# Patient Record
Sex: Male | Born: 1950 | Race: White | Hispanic: No | Marital: Married | Smoking: Current every day smoker
Health system: Southern US, Community
[De-identification: ages and names within clinical notes are randomized; demographics above are authoritative.]

## PROBLEM LIST (undated history)

## (undated) DIAGNOSIS — R569 Unspecified convulsions: Secondary | ICD-10-CM

## (undated) DIAGNOSIS — K5090 Crohn's disease, unspecified, without complications: Secondary | ICD-10-CM

## (undated) DIAGNOSIS — Z8781 Personal history of (healed) traumatic fracture: Secondary | ICD-10-CM

## (undated) DIAGNOSIS — S4292XA Fracture of left shoulder girdle, part unspecified, initial encounter for closed fracture: Secondary | ICD-10-CM

## (undated) DIAGNOSIS — S2239XA Fracture of one rib, unspecified side, initial encounter for closed fracture: Secondary | ICD-10-CM

## (undated) HISTORY — DX: Crohn's disease, unspecified, without complications: K50.90

## (undated) HISTORY — DX: Unspecified convulsions: R56.9

## (undated) HISTORY — DX: Personal history of (healed) traumatic fracture: Z87.81

## (undated) HISTORY — DX: Fracture of left shoulder girdle, part unspecified, initial encounter for closed fracture: S42.92XA

## (undated) HISTORY — DX: Fracture of one rib, unspecified side, initial encounter for closed fracture: S22.39XA

---

## 2003-07-09 DIAGNOSIS — R569 Unspecified convulsions: Secondary | ICD-10-CM

## 2003-07-09 HISTORY — DX: Unspecified convulsions: R56.9

## 2004-02-28 ENCOUNTER — Encounter: Admission: RE | Admit: 2004-02-28 | Discharge: 2004-02-28 | Payer: Self-pay | Admitting: Family Medicine

## 2004-03-07 ENCOUNTER — Emergency Department (HOSPITAL_COMMUNITY): Admission: EM | Admit: 2004-03-07 | Discharge: 2004-03-07 | Payer: Self-pay | Admitting: Emergency Medicine

## 2004-03-13 ENCOUNTER — Encounter (INDEPENDENT_AMBULATORY_CARE_PROVIDER_SITE_OTHER): Payer: Self-pay | Admitting: Cardiology

## 2004-03-13 ENCOUNTER — Ambulatory Visit (HOSPITAL_COMMUNITY): Admission: RE | Admit: 2004-03-13 | Discharge: 2004-03-13 | Payer: Self-pay | Admitting: Neurology

## 2004-06-23 ENCOUNTER — Emergency Department (HOSPITAL_COMMUNITY): Admission: EM | Admit: 2004-06-23 | Discharge: 2004-06-24 | Payer: Self-pay

## 2004-11-09 ENCOUNTER — Ambulatory Visit (HOSPITAL_COMMUNITY): Admission: RE | Admit: 2004-11-09 | Discharge: 2004-11-09 | Payer: Self-pay | Admitting: Gastroenterology

## 2006-12-12 ENCOUNTER — Encounter: Admission: RE | Admit: 2006-12-12 | Discharge: 2006-12-12 | Payer: Self-pay | Admitting: Gastroenterology

## 2006-12-18 ENCOUNTER — Encounter: Admission: RE | Admit: 2006-12-18 | Discharge: 2006-12-18 | Payer: Self-pay | Admitting: Gastroenterology

## 2009-12-22 ENCOUNTER — Encounter: Admission: RE | Admit: 2009-12-22 | Discharge: 2009-12-22 | Payer: Self-pay | Admitting: Gastroenterology

## 2009-12-22 ENCOUNTER — Inpatient Hospital Stay (HOSPITAL_COMMUNITY): Admission: AD | Admit: 2009-12-22 | Discharge: 2009-12-28 | Payer: Self-pay | Admitting: Gastroenterology

## 2010-03-08 ENCOUNTER — Encounter: Admission: RE | Admit: 2010-03-08 | Discharge: 2010-03-08 | Payer: Self-pay | Admitting: Gastroenterology

## 2010-06-07 HISTORY — PX: ABDOMINAL SURGERY: SHX537

## 2010-07-05 ENCOUNTER — Inpatient Hospital Stay (HOSPITAL_COMMUNITY)
Admission: RE | Admit: 2010-07-05 | Discharge: 2010-07-09 | Payer: Self-pay | Source: Home / Self Care | Attending: General Surgery | Admitting: General Surgery

## 2010-07-05 ENCOUNTER — Encounter (INDEPENDENT_AMBULATORY_CARE_PROVIDER_SITE_OTHER): Payer: Self-pay | Admitting: General Surgery

## 2010-09-17 LAB — CBC
HCT: 32.7 % — ABNORMAL LOW (ref 39.0–52.0)
HCT: 42.3 % (ref 39.0–52.0)
Hemoglobin: 13.1 g/dL (ref 13.0–17.0)
MCH: 29.2 pg (ref 26.0–34.0)
MCH: 29.4 pg (ref 26.0–34.0)
MCHC: 32.3 g/dL (ref 30.0–36.0)
MCV: 88.1 fL (ref 78.0–100.0)
MCV: 88.3 fL (ref 78.0–100.0)
MCV: 88.8 fL (ref 78.0–100.0)
Platelets: 186 10*3/uL (ref 150–400)
Platelets: 225 10*3/uL (ref 150–400)
RBC: 4.56 MIL/uL (ref 4.22–5.81)
RDW: 14 % (ref 11.5–15.5)
RDW: 14.2 % (ref 11.5–15.5)
WBC: 12.1 10*3/uL — ABNORMAL HIGH (ref 4.0–10.5)

## 2010-09-17 LAB — BASIC METABOLIC PANEL
Calcium: 8.3 mg/dL — ABNORMAL LOW (ref 8.4–10.5)
Calcium: 8.3 mg/dL — ABNORMAL LOW (ref 8.4–10.5)
Chloride: 101 mEq/L (ref 96–112)
Chloride: 96 mEq/L (ref 96–112)
Creatinine, Ser: 0.86 mg/dL (ref 0.4–1.5)
GFR calc Af Amer: >60 mL/min (ref >60)
GFR calc non Af Amer: >60 mL/min (ref >60)
Glucose, Bld: 94 mg/dL (ref 70–99)

## 2010-09-17 LAB — URINE MICROSCOPIC-ADD ON

## 2010-09-17 LAB — ANAEROBIC CULTURE

## 2010-09-17 LAB — URINALYSIS, ROUTINE W REFLEX MICROSCOPIC
Ketones, ur: NEGATIVE mg/dL
Protein, ur: NEGATIVE mg/dL
Specific Gravity, Urine: 1.016 (ref 1.005–1.030)
pH: 7 (ref 5.0–8.0)

## 2010-09-17 LAB — DIFFERENTIAL
Eosinophils Absolute: 0.1 10*3/uL (ref 0.0–0.7)
Lymphs Abs: 3.3 10*3/uL (ref 0.7–4.0)
Monocytes Relative: 9 % (ref 3–12)

## 2010-09-17 LAB — COMPREHENSIVE METABOLIC PANEL
ALT: 20 U/L (ref 0–53)
AST: 19 U/L (ref 0–37)
Alkaline Phosphatase: 58 U/L (ref 39–117)
Calcium: 9.3 mg/dL (ref 8.4–10.5)
Creatinine, Ser: 0.91 mg/dL (ref 0.4–1.5)
Glucose, Bld: 85 mg/dL (ref 70–99)
Total Protein: 6.7 g/dL (ref 6.0–8.3)

## 2010-09-17 LAB — SURGICAL PCR SCREEN
MRSA, PCR: POSITIVE — AB
Staphylococcus aureus: POSITIVE — AB

## 2010-09-17 LAB — CULTURE, ROUTINE-ABSCESS

## 2010-09-23 LAB — COMPREHENSIVE METABOLIC PANEL
ALT: 10 U/L (ref 0–53)
ALT: 12 U/L (ref 0–53)
ALT: 13 U/L (ref 0–53)
ALT: 22 U/L (ref 0–53)
ALT: 24 U/L (ref 0–53)
ALT: 32 U/L (ref 0–53)
AST: 15 U/L (ref 0–37)
AST: 21 U/L (ref 0–37)
AST: 31 U/L (ref 0–37)
AST: 39 U/L — ABNORMAL HIGH (ref 0–37)
Albumin: 2.3 g/dL — ABNORMAL LOW (ref 3.5–5.2)
Albumin: 2.3 g/dL — ABNORMAL LOW (ref 3.5–5.2)
Albumin: 2.7 g/dL — ABNORMAL LOW (ref 3.5–5.2)
Alkaline Phosphatase: 50 U/L (ref 39–117)
Alkaline Phosphatase: 59 U/L (ref 39–117)
Alkaline Phosphatase: 67 U/L (ref 39–117)
BUN: 12 mg/dL (ref 6–23)
BUN: 14 mg/dL (ref 6–23)
BUN: 14 mg/dL (ref 6–23)
BUN: 2 mg/dL — ABNORMAL LOW (ref 6–23)
CO2: 30 mEq/L (ref 19–32)
CO2: 31 mEq/L (ref 19–32)
CO2: 32 mEq/L (ref 19–32)
CO2: 33 mEq/L — ABNORMAL HIGH (ref 19–32)
Calcium: 8.1 mg/dL — ABNORMAL LOW (ref 8.4–10.5)
Calcium: 8.7 mg/dL (ref 8.4–10.5)
Chloride: 97 mEq/L (ref 96–112)
Chloride: 99 mEq/L (ref 96–112)
Creatinine, Ser: 0.94 mg/dL (ref 0.4–1.5)
Creatinine, Ser: 0.97 mg/dL (ref 0.4–1.5)
Creatinine, Ser: 0.99 mg/dL (ref 0.4–1.5)
Creatinine, Ser: 0.99 mg/dL (ref 0.4–1.5)
GFR calc Af Amer: >60 mL/min (ref >60)
GFR calc Af Amer: >60 mL/min (ref >60)
GFR calc Af Amer: >60 mL/min (ref >60)
GFR calc non Af Amer: >60 mL/min (ref >60)
GFR calc non Af Amer: >60 mL/min (ref >60)
GFR calc non Af Amer: >60 mL/min (ref >60)
Glucose, Bld: 75 mg/dL (ref 70–99)
Glucose, Bld: 81 mg/dL (ref 70–99)
Glucose, Bld: 85 mg/dL (ref 70–99)
Glucose, Bld: 89 mg/dL (ref 70–99)
Glucose, Bld: 97 mg/dL (ref 70–99)
Potassium: 3.4 mEq/L — ABNORMAL LOW (ref 3.5–5.1)
Potassium: 3.5 mEq/L (ref 3.5–5.1)
Potassium: 3.8 mEq/L (ref 3.5–5.1)
Potassium: 4.1 mEq/L (ref 3.5–5.1)
Sodium: 136 mEq/L (ref 135–145)
Sodium: 137 mEq/L (ref 135–145)
Sodium: 139 mEq/L (ref 135–145)
Sodium: 140 mEq/L (ref 135–145)
Total Bilirubin: 0.6 mg/dL (ref 0.3–1.2)
Total Bilirubin: 0.8 mg/dL (ref 0.3–1.2)
Total Bilirubin: 0.8 mg/dL (ref 0.3–1.2)
Total Bilirubin: 1.1 mg/dL (ref 0.3–1.2)
Total Protein: 6.1 g/dL (ref 6.0–8.3)
Total Protein: 6.4 g/dL (ref 6.0–8.3)
Total Protein: 6.6 g/dL (ref 6.0–8.3)
Total Protein: 7 g/dL (ref 6.0–8.3)
Total Protein: 7.3 g/dL (ref 6.0–8.3)

## 2010-09-23 LAB — CBC
HCT: 36.6 % — ABNORMAL LOW (ref 39.0–52.0)
HCT: 38.4 % — ABNORMAL LOW (ref 39.0–52.0)
HCT: 40.2 % (ref 39.0–52.0)
Hemoglobin: 11.7 g/dL — ABNORMAL LOW (ref 13.0–17.0)
Hemoglobin: 12.2 g/dL — ABNORMAL LOW (ref 13.0–17.0)
Hemoglobin: 12.5 g/dL — ABNORMAL LOW (ref 13.0–17.0)
Hemoglobin: 13.1 g/dL (ref 13.0–17.0)
Hemoglobin: 13.6 g/dL (ref 13.0–17.0)
MCHC: 33.5 g/dL (ref 30.0–36.0)
MCHC: 33.7 g/dL (ref 30.0–36.0)
MCV: 87.5 fL (ref 78.0–100.0)
MCV: 87.9 fL (ref 78.0–100.0)
MCV: 88 fL (ref 78.0–100.0)
MCV: 88.7 fL (ref 78.0–100.0)
MCV: 88.9 fL (ref 78.0–100.0)
Platelets: 338 10*3/uL (ref 150–400)
Platelets: 344 10*3/uL (ref 150–400)
Platelets: 375 10*3/uL (ref 150–400)
RBC: 3.97 MIL/uL — ABNORMAL LOW (ref 4.22–5.81)
RBC: 4.25 MIL/uL (ref 4.22–5.81)
RBC: 4.43 MIL/uL (ref 4.22–5.81)
RDW: 11.7 % (ref 11.5–15.5)
RDW: 12.5 % (ref 11.5–15.5)
RDW: 12.5 % (ref 11.5–15.5)
WBC: 7.1 10*3/uL (ref 4.0–10.5)
WBC: 9.4 10*3/uL (ref 4.0–10.5)
WBC: 9.7 10*3/uL (ref 4.0–10.5)

## 2010-09-23 LAB — DIFFERENTIAL
Basophils Absolute: 0 10*3/uL (ref 0.0–0.1)
Basophils Absolute: 0 10*3/uL (ref 0.0–0.1)
Basophils Absolute: 0 10*3/uL (ref 0.0–0.1)
Basophils Absolute: 0.1 10*3/uL (ref 0.0–0.1)
Basophils Relative: 0 % (ref 0–1)
Basophils Relative: 0 % (ref 0–1)
Basophils Relative: 1 % (ref 0–1)
Basophils Relative: 1 % (ref 0–1)
Eosinophils Absolute: 0.2 10*3/uL (ref 0.0–0.7)
Eosinophils Absolute: 0.2 10*3/uL (ref 0.0–0.7)
Eosinophils Absolute: 0.2 10*3/uL (ref 0.0–0.7)
Eosinophils Absolute: 0.4 10*3/uL (ref 0.0–0.7)
Eosinophils Relative: 2 % (ref 0–5)
Eosinophils Relative: 3 % (ref 0–5)
Eosinophils Relative: 3 % (ref 0–5)
Eosinophils Relative: 5 % (ref 0–5)
Lymphocytes Relative: 12 % (ref 12–46)
Lymphocytes Relative: 16 % (ref 12–46)
Lymphocytes Relative: 17 % (ref 12–46)
Lymphs Abs: 1.3 10*3/uL (ref 0.7–4.0)
Lymphs Abs: 1.7 10*3/uL (ref 0.7–4.0)
Lymphs Abs: 1.9 10*3/uL (ref 0.7–4.0)
Monocytes Absolute: 1 10*3/uL (ref 0.1–1.0)
Monocytes Absolute: 1.2 10*3/uL — ABNORMAL HIGH (ref 0.1–1.0)
Monocytes Relative: 11 % (ref 3–12)
Monocytes Relative: 11 % (ref 3–12)
Monocytes Relative: 12 % (ref 3–12)
Monocytes Relative: 17 % — ABNORMAL HIGH (ref 3–12)
Neutro Abs: 4.8 10*3/uL (ref 1.7–7.7)
Neutro Abs: 6.8 10*3/uL (ref 1.7–7.7)
Neutro Abs: 7.6 10*3/uL (ref 1.7–7.7)
Neutrophils Relative %: 58 % (ref 43–77)
Neutrophils Relative %: 59 % (ref 43–77)
Neutrophils Relative %: 60 % (ref 43–77)
Neutrophils Relative %: 70 % (ref 43–77)

## 2010-09-23 LAB — CLOSTRIDIUM DIFFICILE EIA: C difficile Toxins A+B, EIA: NEGATIVE

## 2010-11-23 NOTE — Op Note (Signed)
NAME:  Miguel Hoover, Miguel Hoover               ACCOUNT NO.:  [id number]   MEDICAL RECORD NO.:  [id number]          PATIENT TYPE:  AMB   LOCATION:  ENDO                         FACILITY:  [facility]   PHYSICIAN:  Wonda Horner, M.D.   DATE OF BIRTH:  1951/04/17   DATE OF PROCEDURE:  11/09/2004  DATE OF DISCHARGE:                                 OPERATIVE REPORT   PROCEDURE:  Colonoscopy.   INDICATIONS:  Screening.   INFORMED CONSENT:  Informed consent was obtained after explanation of the  risks of the bleeding and perforation.   PREMEDICATION:  Fentanyl 70 mcg intravenously, Versed 7 mg intravenously.   DESCRIPTION OF PROCEDURE:  With the patient in the left lateral decubitus  position, a rectal exam was performed and no masses were felt. The Olympus  colonoscope was inserted into the rectum and advanced throughout the colon  to the cecum. Cecal landmarks were identified. The cecum and ascending colon  along with the transverse colon were normal. The descending colon, sigmoid,  and rectum were normal. He tolerated the procedure well without  complications.   IMPRESSION:  Normal colonoscopy to the cecum.   RECOMMENDATIONS:  I would recommend a follow-up screening colonoscopy again  in 10 years.      SFG/MEDQ  D:  11/09/2004  T:  11/09/2004  Job:  [id number]   cc:   Dr. Sabra Heck

## 2010-12-10 NOTE — Discharge Summary (Signed)
NAME:  Miguel Hoover, Miguel Hoover               ACCOUNT NO.:  [id number]  MEDICAL RECORD NO.:  [id number]           PATIENT TYPE:  I  LOCATION:  2778                         FACILITY:  [facility]  PHYSICIAN:  Marland Kitchen T. Woodfin Kiss, M.D.DATE OF BIRTH:  [birth year]  DATE OF ADMISSION:  07/05/2010 DATE OF DISCHARGE:  07/09/2010                              DISCHARGE SUMMARY   DISCHARGE DIAGNOSES: 1. Crohn disease of the terminal ileum with stricture and     fistulization and abscess. 2. Subcutaneous mass, left elbow.  OPERATIONS AND PROCEDURES:  Laparoscopy and open ileocolectomy with anastomosis and excision of skin and subcutaneous mass, left elbow; Dr. Excell Seltzer; July 05, 2010.  HISTORY OF PRESENT ILLNESS:  Miguel Hoover is a 60 year old male with 27- year history of intermittent abdominal pain, in June of 2011 diagnosed with Crohn disease with thickening of the terminal ileum and a small adjacent phlegmon consistent with walled off perforation.  He was treated with IV antibiotics, steroids and improved and was discharged. He has been followed by Dr. Penelope Coop.  He has been on prednisone essentially continuously since that time.  He will have intermittent improvement and worsening of abdominal pain and nausea.  He had colonoscopy which was essentially negative, although terminal ileum was not able to be intubated.  Upper GI series shows narrowing of the terminal ileum.  The patient presents with ongoing abdominal pain, nausea.  He also has a mass on his left elbow which he states has been present since an injury at work about a year ago.  Physical exam shows some moderate right lower quadrant tenderness on abdominal exam.  Extremity exam revealed about 4 cm subcutaneous mass overlying the left elbow.  The patient after discussion in the office is electively admitted for ileocolectomy for Crohn disease with stricture and partial obstruction having excision of the left elbow mass.  HOSPITAL COURSE:   The patient was admitted on the morning of his procedure.  Laparoscopy was performed which showed intense adhesions around the terminal ileum and inflammatory process.  He was converted to open procedure and had findings of active Crohn disease of the terminal ileum with fistulization and abscess into the anterior abdominal wall. He underwent open ileocolectomy with primary anastomosis and also removal of the large subcutaneous mass over his elbow.  Postoperatively his course was smooth.  He was maintained on IV Invanz pre and postoperatively due to the abdominal wall abscess.  His NG tube was discontinued on the first postoperative day.  He was started on clear liquids on the second postoperative day.  His steroids were tapered.  By the third postoperative day, he was afebrile, white count was 8.8. Cultures from the abdominal wall were no growth.  By July 09, 2010, he was tolerating regular diet and had a couple of bowel movements. Incision over his elbow was clean.  Abdominal wound was clean and abdomen was benign.  Pathology showed chronic active Crohn's enteritis with acute serositis and inflammation and exudate.  The soft tissue mass of his elbow was a benign ruptured epidermal inclusion cyst.  DISCHARGE MEDICATIONS:  Same on admission plus Tylox for pain.  FOLLOWUP:  Follow up is to be in my office in 2 weeks.     Darene Lamer. Gerre Ranum, M.D.     [place]  D:  12/04/2010  T:  12/04/2010  Job:  [id number]  Electronically Signed by Excell Seltzer M.D. on 12/10/2010 03:35:59 PM

## 2011-12-03 IMAGING — RF DG UGI W/ SMALL BOWEL HIGH DENSITY
19 of 24 series · 19 of 24 positions shown · IV contrast (agent unspecified)
Comparison: CTs including 12/26/2009.

CLINICAL DATA: Right lower quadrant abdominal pain.  Abnormal
terminal ileum on prior CTs.  Family history of Crohn's disease.

UPPER GI W/ SMALL BOWEL HIGH DENSITY
TECHNIQUE: Upper GI series performed with high density barium and
effervescent agent. Thin barium also used. Subsequently, serial
images of the small bowel were obtained including spot views of the
terminal ileum.
Fluoroscopy Time:
Contrast: Thin and thick barium.

[Series 1: run · 1 of 1 slices shown (1 of 19)]
[im 1/1]
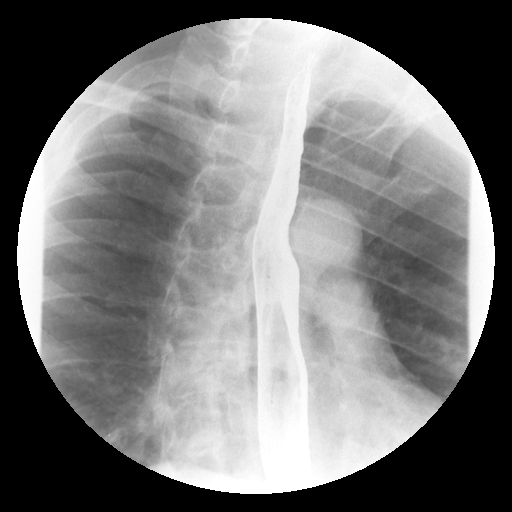

[Series 2: run · 1 of 1 slices shown (2 of 19)]
[im 1/1]
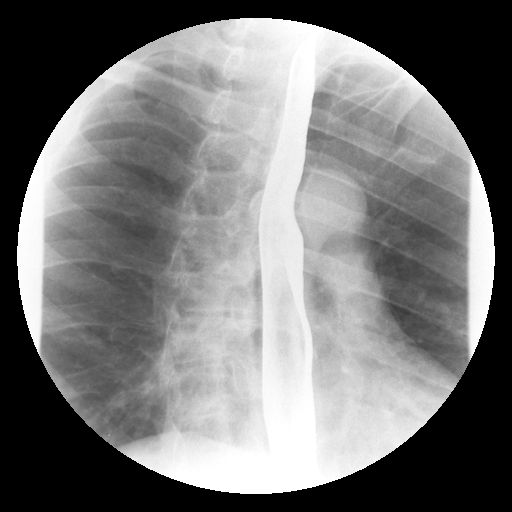

[Series 4: run · 1 of 1 slices shown (3 of 19)]
[im 1/1]
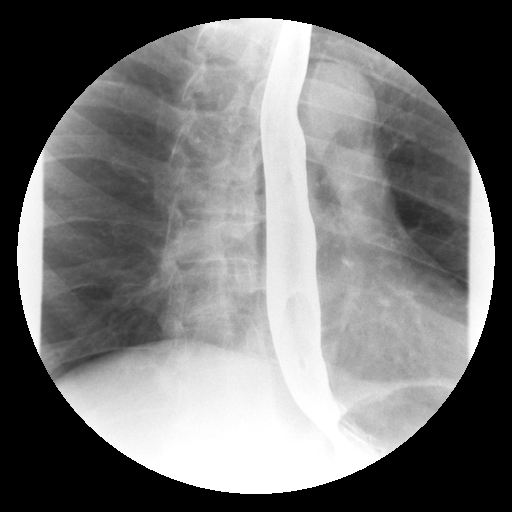

[Series 5: run · 1 of 1 slices shown (4 of 19)]
[im 1/1]
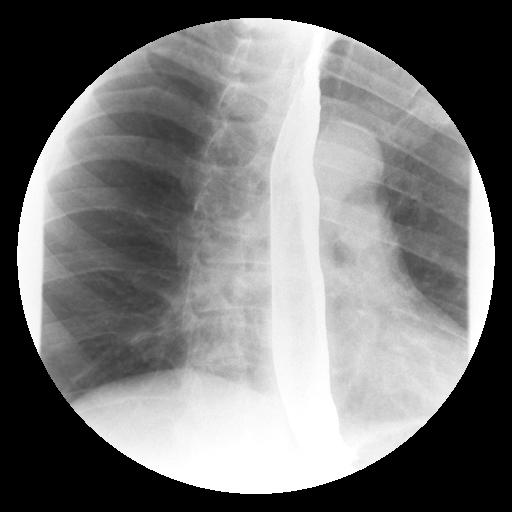

[Series 6: run · 1 of 1 slices shown (5 of 19)]
[im 1/1]
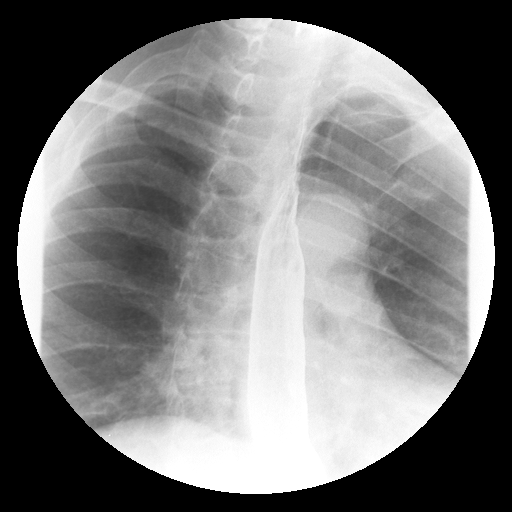

[Series 7: run · 1 of 1 slices shown (6 of 19)]
[im 1/1]
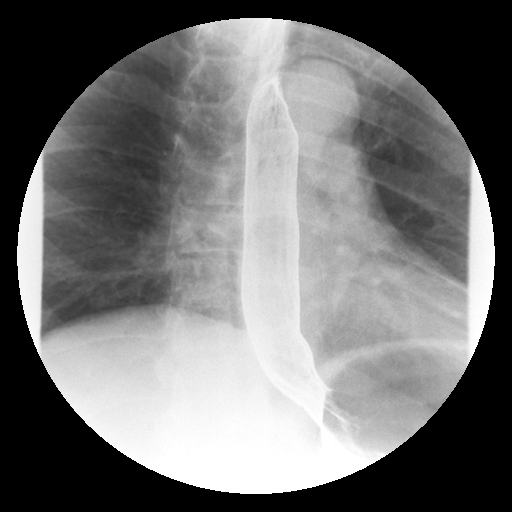

[Series 9: run · 1 of 1 slices shown (7 of 19)]
[im 1/1]
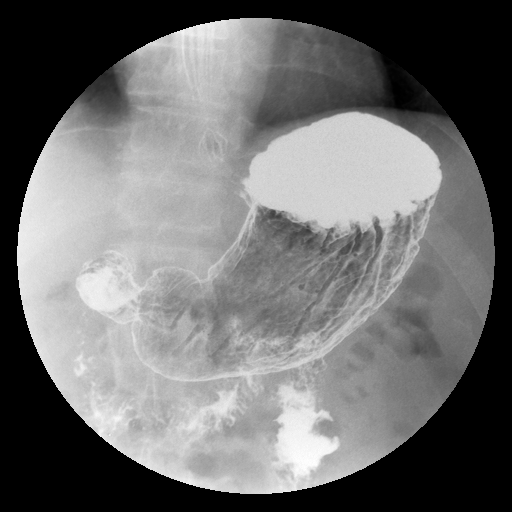

[Series 10: run · 1 of 1 slices shown (8 of 19)]
[im 1/1]
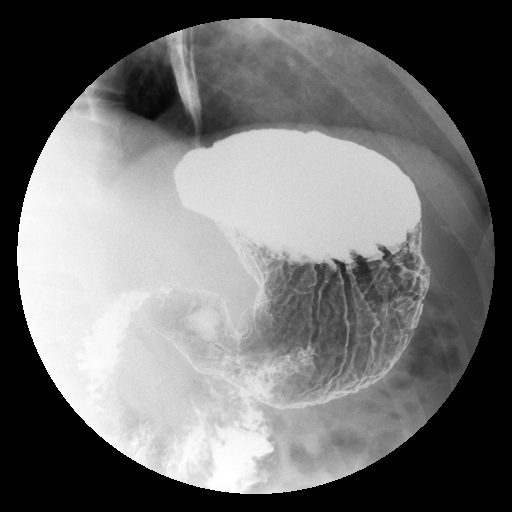

[Series 11: run · 1 of 1 slices shown (9 of 19)]
[im 1/1]
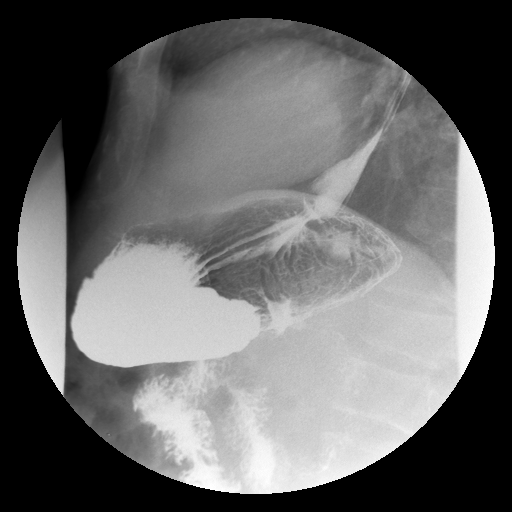

[Series 13: run · 1 of 1 slices shown (10 of 19)]
[im 1/1]
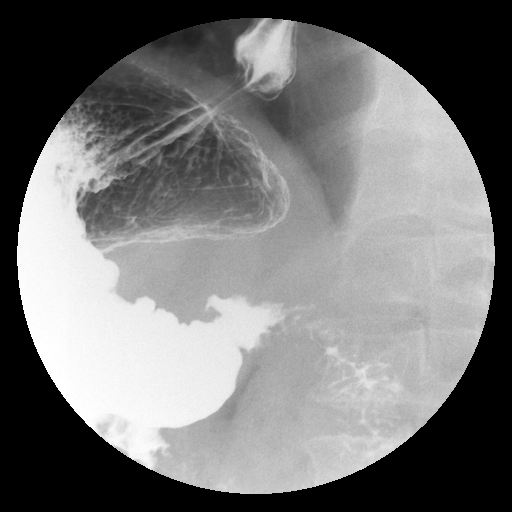

[Series 14: run · 1 of 1 slices shown (11 of 19)]
[im 1/1]
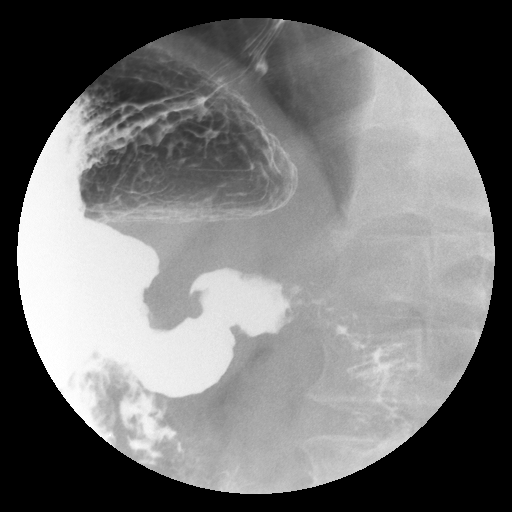

[Series 15: run · 1 of 1 slices shown (12 of 19)]
[im 1/1]
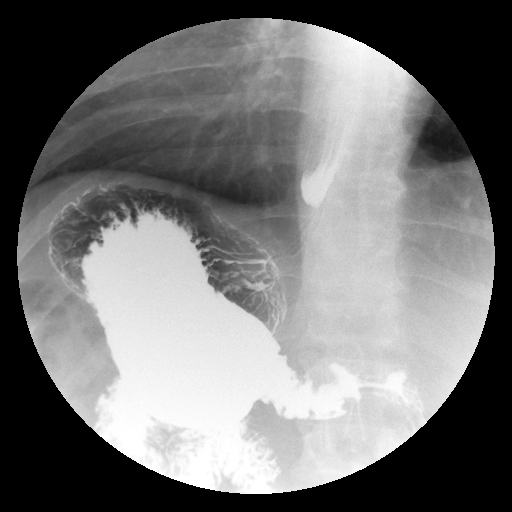

[Series 16: run · 1 of 1 slices shown (13 of 19)]
[im 1/1]
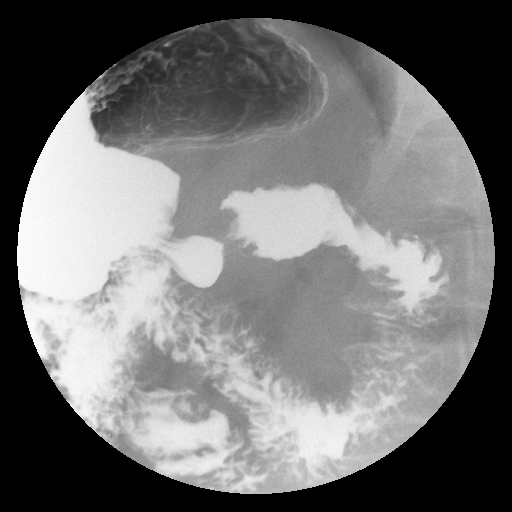

[Series 18: run · 1 of 1 slices shown (14 of 19)]
[im 1/1]
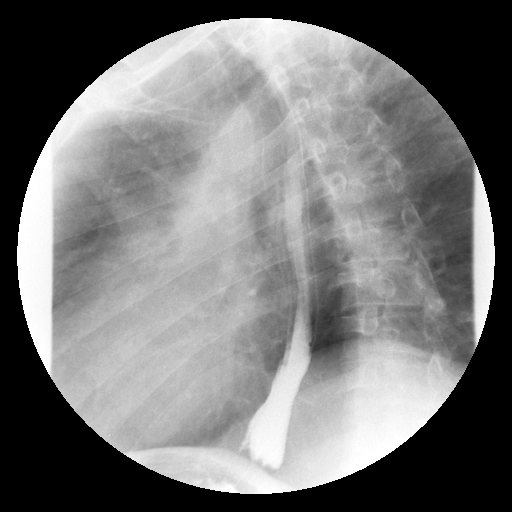

[Series 19: run · 1 of 1 slices shown (15 of 19)]
[im 1/1]
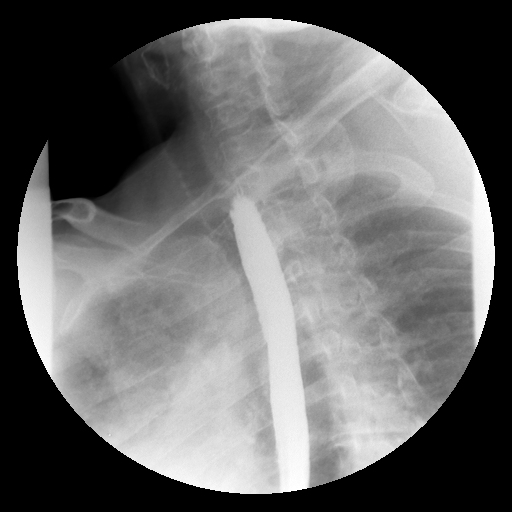

[Series 20: run · 1 of 1 slices shown (16 of 19)]
[im 1/1]
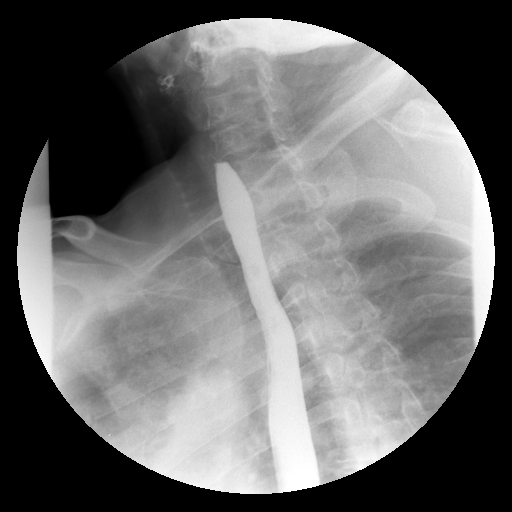

[Series 21: run · 1 of 1 slices shown (17 of 19)]
[im 1/1]
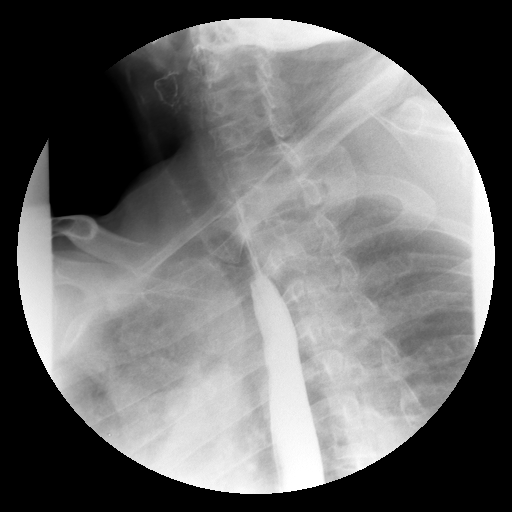

[Series 23: run · 1 of 1 slices shown (18 of 19)]
[im 1/1]
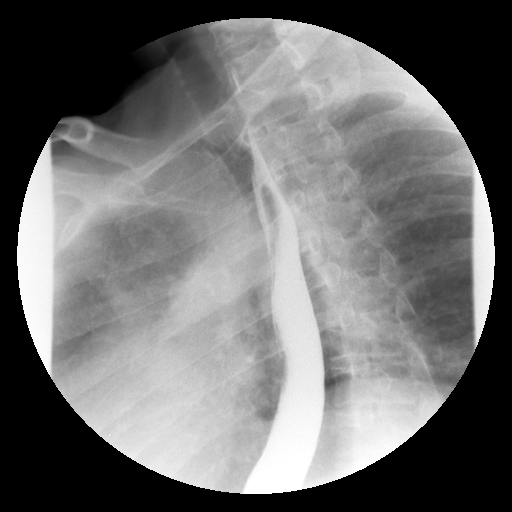

[Series 24: run · 1 of 1 slices shown (19 of 19)]
[im 1/1]
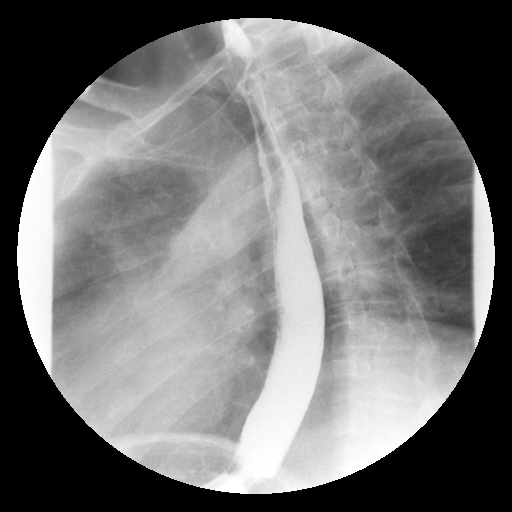

[19 of 24 positions shown; findings below may reference images not displayed]

FINDINGS: Pre procedure scout film demonstrates an unremarkable
bowel gas pattern.

Double contrast evaluation of the esophagus demonstrates no mucosal
abnormality.

Double contrast evaluation of the stomach demonstrates no mass or
polyp.  No evidence of ulcer.

The duodenal bulb and C-loop are within normal limits.

There is a tiny hiatal hernia.  There is mild spontaneous reflux of
contrast into the lower esophagus.

Evaluation of primary peristalsis demonstrates a normal primary
peristaltic wave.

Full column evaluation of the esophagus demonstrates no evidence of
narrowing or stricture.

Small bowel follow-through images demonstrate normal appearance of
the proximal small bowel.  Normal small bowel transit time,
approximately 30 minutes.

The cecum is positioned somewhat high.  The terminal ileum inserts
along its inferior portion.  There is marked narrowing of the
terminal ileum.  Most apparent on series 39.  There is a segment of
relatively normal caliber distal ileum with a skip area of
narrowing.  Series 43.
IMPRESSION: 1.  Findings which are highly suspicious for Crohn's disease.  2
areas of terminal and distal ileal narrowing.  Findings discussed
with the patient prior to this dictation.
2.  No evidence of bowel obstruction or other acute complication.
3.  Small hiatal hernia with minimal gastroesophageal reflux
spontaneously.

## 2012-11-16 ENCOUNTER — Encounter: Payer: Self-pay | Admitting: Neurology

## 2012-11-17 ENCOUNTER — Ambulatory Visit: Payer: Self-pay | Admitting: Neurology

## 2013-04-05 ENCOUNTER — Telehealth: Payer: Self-pay | Admitting: *Deleted

## 2013-04-05 MED ORDER — LAMOTRIGINE 200 MG PO TABS: ORAL_TABLET | ORAL | Status: DC

## 2013-04-05 NOTE — Telephone Encounter (Signed)
Rx sent 

## 2013-04-05 NOTE — Telephone Encounter (Signed)
Can you please refill generic lamictal and send it to [facility] on [address]. He has an appointment with Dr. Pearlean Brownie next month

## 2013-04-27 ENCOUNTER — Ambulatory Visit: Payer: Self-pay | Admitting: Neurology

## 2013-06-10 ENCOUNTER — Ambulatory Visit (INDEPENDENT_AMBULATORY_CARE_PROVIDER_SITE_OTHER): Payer: Self-pay | Admitting: Neurology

## 2013-06-10 ENCOUNTER — Encounter: Payer: Self-pay | Admitting: Neurology

## 2013-06-10 ENCOUNTER — Encounter (INDEPENDENT_AMBULATORY_CARE_PROVIDER_SITE_OTHER): Payer: Self-pay

## 2013-06-10 VITALS — BP 142/82 | HR 90 | Temp 98.2°F | Ht 65.0 in | Wt 167.0 lb

## 2013-06-10 DIAGNOSIS — I6350 Cerebral infarction due to unspecified occlusion or stenosis of unspecified cerebral artery: Secondary | ICD-10-CM

## 2013-06-20 ENCOUNTER — Other Ambulatory Visit: Payer: Self-pay | Admitting: Neurology

## 2013-06-20 NOTE — Telephone Encounter (Signed)
Patient has not been in for last 3 appts, now scheduled for Feb

## 2013-06-29 NOTE — Progress Notes (Signed)
Patient left before he could be seen

## 2013-08-17 ENCOUNTER — Ambulatory Visit (INDEPENDENT_AMBULATORY_CARE_PROVIDER_SITE_OTHER): Payer: Managed Care, Other (non HMO) | Admitting: Neurology

## 2013-08-17 ENCOUNTER — Encounter: Payer: Self-pay | Admitting: Neurology

## 2013-08-17 ENCOUNTER — Encounter (INDEPENDENT_AMBULATORY_CARE_PROVIDER_SITE_OTHER): Payer: Self-pay

## 2013-08-17 VITALS — BP 123/75 | HR 74 | Ht 65.0 in | Wt 167.0 lb

## 2013-08-17 DIAGNOSIS — R569 Unspecified convulsions: Secondary | ICD-10-CM

## 2013-08-17 DIAGNOSIS — G40209 Localization-related (focal) (partial) symptomatic epilepsy and epileptic syndromes with complex partial seizures, not intractable, without status epilepticus: Secondary | ICD-10-CM

## 2013-08-17 MED ORDER — LAMOTRIGINE 200 MG PO TABS
200.0000 mg | ORAL_TABLET | Freq: Every day | ORAL | Status: DC
Start: 2013-08-17 — End: 2014-04-14

## 2013-08-17 NOTE — Patient Instructions (Signed)
I had a long discussion with the patient regarding his seizures and long seizure free interval of nearly 10 years. I discussed risk benefit of tapering and stopping seizure medications but at the present time he is unwilling to do so. He has been taking lamotrigine 200 mg once a day for the last 2 years without breakthrough seizures and hence I will continue the present regimen for now. He was given refills. Check CBC and complete metabolic panel labs. Return for followup in a year her call earlier if necessary.

## 2013-08-17 NOTE — Progress Notes (Signed)
[facility] [address] [place]. [place] [address] [phone]       OFFICE FOLLOW-UP NOTE  Mr. Miguel Hoover. Date of Birth:  08-11-1950 Medical Record Number:  [id number]   HPI: 38 year Caucasian male with remote history of 3 seizures in 2005 unclear as to wether they were probably alcohol related or localization related epilepsy but he has been on lamotrigine since then. He was last seen in the office in 2012 and at that time was taking 100 mg in the morning and 150 mg at night. He states that however a couple of years ago his switch to taking 200 mg once a day and he's done well has not had any seizures  . In fact his last seizure was in 2005. He has been reluctant to discontinue medication   despite not having had seizures over the years. His tolerating the current dose of Lamictal 200 mg daily without significant side effects. He has not had any blood work checked in recent years. 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 He has no complaints today  ROS:   14 system review of systems is positive for no complaints today PMH:  Past Medical History  Diagnosis Date  . Crohn's disease     Social History:  History   Social History  . Marital Status: Married    Spouse Name: retta    Number of Children: 2  . Years of Education: college    Occupational History  . ESTIMATOR    Social History Main Topics  . Smoking status: Current Every Day Smoker    Types: Cigarettes  . Smokeless tobacco: Not on file  . Alcohol Use: No  . Drug Use: No  . Sexual Activity: Yes   Other Topics Concern  . Not on file   Social History Narrative  . No narrative on file    Medications:   Current Outpatient Prescriptions on File Prior to Visit  Medication Sig Dispense Refill  . levothyroxine (SYNTHROID, LEVOTHROID) 112 MCG tablet Take 112 mcg by mouth daily before breakfast.       No current facility-administered  medications on file prior to visit.    Allergies:  No Known Allergies  Physical Exam General: well developed, well nourished, seated, in no evident distress Head: head normocephalic and atraumatic. Orohparynx benign Neck: supple with no carotid or supraclavicular bruits Cardiovascular: regular rate and rhythm, no murmurs Musculoskeletal: no deformity Skin:  no rash/petichiae Vascular:  Normal pulses all extremities Filed Vitals:   08/17/13 1522  BP: 123/75  Pulse: 74   Neurologic Exam Mental Status: Awake and fully alert. Oriented to place and time. Recent and remote memory intact. Attention span, concentration and fund of knowledge appropriate. Mood and affect appropriate.  Cranial Nerves: Fundoscopic exam not done. Pupils equal, briskly reactive to light. Extraocular movements full without nystagmus. Visual fields full to confrontation. Hearing intact. Facial sensation intact. Face, tongue, palate moves normally and symmetrically.  Motor: Normal bulk and tone. Normal strength in all tested extremity muscles. Sensory.: intact to touch and pinprick and vibratory sensation.  Coordination: Rapid alternating movements normal in all extremities. Finger-to-nose and heel-to-shin performed accurately bilaterally. Gait and Station: Arises from chair without difficulty. Stance is normal. Gait demonstrates normal stride length and balance . Able to heel, toe and tandem walk without difficulty.  Reflexes: 1+ and symmetric. Toes downgoing.       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    PLAN: I had a long discussion with the patient regarding his seizures and long seizure free interval of nearly 10 years. I discussed risk benefit of tapering and stopping seizure medications but at the present time he is unwilling to do so. He has been taking  lamotrigine 200 mg once a day for the last 2 years without breakthrough seizures and hence I will continue the present regimen for now. He was given refills. Check CBC and complete metabolic panel labs. Return for followup in a year her call earlier if necessar   Note: This document was prepared with digital dictation and possible smart phrase technology. Any transcriptional errors that result from this process are unintentional

## 2014-01-05 ENCOUNTER — Encounter: Payer: Self-pay | Admitting: Neurology

## 2014-04-14 ENCOUNTER — Other Ambulatory Visit: Payer: Self-pay | Admitting: Neurology

## 2014-08-17 ENCOUNTER — Ambulatory Visit: Payer: Managed Care, Other (non HMO) | Admitting: Neurology

## 2014-09-20 ENCOUNTER — Ambulatory Visit: Payer: Managed Care, Other (non HMO) | Admitting: Neurology

## 2014-09-22 ENCOUNTER — Ambulatory Visit (INDEPENDENT_AMBULATORY_CARE_PROVIDER_SITE_OTHER): Payer: PRIVATE HEALTH INSURANCE | Admitting: Neurology

## 2014-09-22 ENCOUNTER — Encounter: Payer: Self-pay | Admitting: Neurology

## 2014-09-22 VITALS — BP 126/85 | HR 77 | Ht 66.0 in | Wt 165.0 lb

## 2014-09-22 DIAGNOSIS — G40009 Localization-related (focal) (partial) idiopathic epilepsy and epileptic syndromes with seizures of localized onset, not intractable, without status epilepticus: Secondary | ICD-10-CM | POA: Diagnosis not present

## 2014-09-22 MED ORDER — LAMOTRIGINE 200 MG PO TABS: 200.0000 mg | ORAL_TABLET | Freq: Every day | ORAL | Status: DC

## 2014-09-22 NOTE — Patient Instructions (Signed)
I had a long discussion with the patient with regards to his remote seizures and discuss briefly risk-benefit of tapering and trying to come off the medication since he has been seizure-free for several years. The patient however is comfortable taking the current dose of Lamotrigine and does not want to try to come off it. Plan to check CBC and complete metabolic panel labs today. He was given a refill for Lamictal for a year. Return for follow-up in a year or call earlier if necessary

## 2014-09-22 NOTE — Progress Notes (Signed)
[facility] [address] [place]. [place] [address] [phone]       OFFICE FOLLOW-UP NOTE  Miguel Hoover. Date of Birth:  April 25, 1951 Medical Record Number:  [id number]   HPI: 38 year Caucasian male with remote history of 3 seizures in 2005 unclear as to wether they were probably alcohol related or localization related epilepsy but he has been on lamotrigine since then. He was last seen in the office in 2012 and at that time was taking 100 mg in the morning and 150 mg at night. He states that however a couple of years ago his switch to taking 200 mg once a day and he's done well has not had any seizures  . In fact his last seizure was in 2005. He has been reluctant to discontinue medication   despite not having had seizures over the years. His tolerating the current dose of Lamictal 200 mg daily without significant side effects. He has not had any blood work checked in recent years. 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 He has no complaints today Update 09/22/2014 : He returns for follow-up after last visit a year ago. He continues to do well without any breakthrough seizures now for 10 years. His tolerating lamotrigine 200 mg daily without any side effects. He has not had any interval new health problems since last visit. He has no new complaints today. He is not sure if he had any blood work done after his last visit with me. ROS:   14 system review of systems is positive for no complaints today PMH:  Past Medical History  Diagnosis Date  . Crohn's disease     Social History:  History   Social History  . Marital Status: Married    Spouse Name: retta  . Number of Children: 2  . Years of Education: college    Occupational History  . ESTIMATOR    Social History Main Topics  . Smoking status: Current Every Day Smoker    Types: Cigarettes  . Smokeless tobacco: Not on file  . Alcohol Use: No  . Drug  Use: No  . Sexual Activity: Yes   Other Topics Concern  . Not on file   Social History Narrative    Medications:   No current outpatient prescriptions on file prior to visit.   No current facility-administered medications on file prior to visit.    Allergies:  No Known Allergies  Physical Exam General: well developed, well nourished middle aged Caucasian male, seated, in no evident distress Head: head normocephalic and atraumatic. Orohparynx benign Neck: supple with no carotid or supraclavicular bruits Cardiovascular: regular rate and rhythm, no murmurs Musculoskeletal: no deformity Skin:  no rash/petichiae Vascular:  Normal pulses all extremities Filed Vitals:   09/22/14 1017  BP: 126/85  Pulse: 77   Neurologic Exam Mental Status: Awake and fully alert. Oriented to place and time. Recent and remote memory intact. Attention span, concentration and fund of knowledge appropriate. Mood and affect appropriate.  Cranial Nerves: Fundoscopic exam not done. Pupils equal, briskly reactive to light. Extraocular movements full without nystagmus. Visual fields full to confrontation. Hearing intact. Facial sensation intact. Face, tongue, palate moves normally and symmetrically.  Motor: Normal bulk and tone. Normal strength in all tested extremity muscles. Sensory.: intact to touch and pinprick and vibratory sensation.  Coordination: Rapid alternating movements normal in all extremities. Finger-to-nose and heel-to-shin performed accurately bilaterally. Gait and Station: Arises from chair  without difficulty. Stance is normal. Gait demonstrates normal stride length and balance . Able to heel, toe and tandem walk without difficulty.  Reflexes: 1+ and symmetric. Toes downgoing.       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    PLAN: I had a  long discussion with the patient with regards to his remote seizures and discuss briefly risk-benefit of tapering and trying to come off the medication since he has been seizure-free for several years. The patient however is comfortable taking the current dose of Lamotrigine and does not want to try to come off it. Plan to check CBC and complete metabolic panel labs today. He was given a refill for Lamictal for a year. Return for follow-up in a year or call earlier if necessary    Note: This document was prepared with digital dictation and possible smart phrase technology. Any transcriptional errors that result from this process are unintentional

## 2014-09-23 LAB — COMPREHENSIVE METABOLIC PANEL
A/G RATIO: 1.3 (ref 1.1–2.5)
ALK PHOS: 74 IU/L (ref 39–117)
ALT: 30 IU/L (ref 0–44)
AST: 25 IU/L (ref 0–40)
Albumin: 4.3 g/dL (ref 3.6–4.8)
BUN / CREAT RATIO: 13 (ref 10–22)
BUN: 14 mg/dL (ref 8–27)
Bilirubin Total: 0.7 mg/dL (ref 0.0–1.2)
CO2: 25 mmol/L (ref 18–29)
CREATININE: 1.1 mg/dL (ref 0.76–1.27)
Calcium: 10 mg/dL (ref 8.6–10.2)
Chloride: 99 mmol/L (ref 97–108)
GFR, EST AFRICAN AMERICAN: 83 mL/min/{1.73_m2} (ref >59)
GFR, EST NON AFRICAN AMERICAN: 72 mL/min/{1.73_m2} (ref >59)
GLOBULIN, TOTAL: 3.3 g/dL (ref 1.5–4.5)
Glucose: 105 mg/dL — ABNORMAL HIGH (ref 65–99)
Potassium: 5.3 mmol/L — ABNORMAL HIGH (ref 3.5–5.2)
SODIUM: 138 mmol/L (ref 134–144)
Total Protein: 7.6 g/dL (ref 6.0–8.5)

## 2014-09-23 LAB — CBC
HEMATOCRIT: 49.5 % (ref 37.5–51.0)
Hemoglobin: 16.7 g/dL (ref 12.6–17.7)
MCH: 30.8 pg (ref 26.6–33.0)
MCHC: 33.7 g/dL (ref 31.5–35.7)
MCV: 91 fL (ref 79–97)
PLATELETS: 221 10*3/uL (ref 150–379)
RBC: 5.43 x10E6/uL (ref 4.14–5.80)
RDW: 13.6 % (ref 12.3–15.4)
WBC: 8.4 10*3/uL (ref 3.4–10.8)

## 2015-02-06 DIAGNOSIS — S4292XA Fracture of left shoulder girdle, part unspecified, initial encounter for closed fracture: Secondary | ICD-10-CM

## 2015-02-06 DIAGNOSIS — Z8781 Personal history of (healed) traumatic fracture: Secondary | ICD-10-CM

## 2015-02-06 DIAGNOSIS — S2249XA Multiple fractures of ribs, unspecified side, initial encounter for closed fracture: Secondary | ICD-10-CM

## 2015-02-06 HISTORY — DX: Personal history of (healed) traumatic fracture: Z87.81

## 2015-02-06 HISTORY — DX: Multiple fractures of ribs, unspecified side, initial encounter for closed fracture: S22.49XA

## 2015-02-06 HISTORY — DX: Fracture of left shoulder girdle, part unspecified, initial encounter for closed fracture: S42.92XA

## 2015-02-13 ENCOUNTER — Other Ambulatory Visit: Payer: Self-pay | Admitting: Orthopedic Surgery

## 2015-02-13 DIAGNOSIS — M25512 Pain in left shoulder: Secondary | ICD-10-CM

## 2015-02-14 ENCOUNTER — Other Ambulatory Visit: Payer: Self-pay | Admitting: Orthopedic Surgery

## 2015-02-14 ENCOUNTER — Ambulatory Visit
Admission: RE | Admit: 2015-02-14 | Discharge: 2015-02-14 | Disposition: A | Discharge status: Home / Self Care | Payer: PRIVATE HEALTH INSURANCE | Source: Ambulatory Visit | Attending: Orthopedic Surgery | Admitting: Orthopedic Surgery

## 2015-02-14 DIAGNOSIS — M25512 Pain in left shoulder: Secondary | ICD-10-CM

## 2015-02-15 ENCOUNTER — Other Ambulatory Visit: Payer: Self-pay

## 2015-09-20 ENCOUNTER — Ambulatory Visit (INDEPENDENT_AMBULATORY_CARE_PROVIDER_SITE_OTHER): Payer: PRIVATE HEALTH INSURANCE | Admitting: Nurse Practitioner

## 2015-09-20 ENCOUNTER — Ambulatory Visit: Payer: PRIVATE HEALTH INSURANCE | Admitting: Neurology

## 2015-09-20 ENCOUNTER — Encounter: Payer: Self-pay | Admitting: Nurse Practitioner

## 2015-09-20 VITALS — BP 118/74 | HR 77 | Ht 66.0 in | Wt 167.4 lb

## 2015-09-20 DIAGNOSIS — G40209 Localization-related (focal) (partial) symptomatic epilepsy and epileptic syndromes with complex partial seizures, not intractable, without status epilepticus: Secondary | ICD-10-CM | POA: Diagnosis not present

## 2015-09-20 MED ORDER — LAMOTRIGINE 200 MG PO TABS: 200.0000 mg | ORAL_TABLET | Freq: Every day | ORAL | Status: DC

## 2015-09-20 NOTE — Progress Notes (Signed)
[facility]  PATIENT: Miguel Hoover. DOB: March 14, 1951   REASON FOR VISIT: Follow-up for seizure disorder HISTORY FROM: Patient    HISTORY OF PRESENT ILLNESS: HISTORY: PS89 year Caucasian male with remote history of 3 seizures in 2005 unclear as to wether they were probably alcohol related or localization related epilepsy but he has been on lamotrigine since then. He was last seen in the office in 2012 and at that time was taking 100 mg in the morning and 150 mg at night. He states that however a couple of years ago his switch to taking 200 mg once a day and he's done well has not had any seizures . In fact his last seizure was in 2005. He has been reluctant to discontinue medication despite not having had seizures over the years. His tolerating the current dose of Lamictal 200 mg daily without significant side effects. He has not had any blood work checked in recent years. 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 He has no complaints today Update 3/17/2016PS : He returns for follow-up after last visit a year ago. He continues to do well without any breakthrough seizures now for 10 years. His tolerating lamotrigine 200 mg daily without any side effects. He has not had any interval new health problems since last visit. He has no new complaints today. He is not sure if he had any blood work done after his last visit with me. UPDATE: 03/15/2017CMMr. Hoover, 65 year old male returns for follow-up. He has a history of seizure disorder and last seizure was 11 years ago. He has had no new interval problems except he did fracture shoulder when he [place] a [place]. His labs are obtained through primary care and he says he just had those done  last week. He denies any side effects to Lamictal. He returns for reevaluation.  REVIEW OF SYSTEMS: Full 14 system review of systems performed and  notable only for those listed, all others are neg:  Constitutional: neg  Cardiovascular: neg Ear/Nose/Throat: neg  Skin: neg Eyes: neg Respiratory: neg Gastroitestinal: neg  Hematology/Lymphatic: neg  Endocrine: neg Musculoskeletal:neg Allergy/Immunology: neg Neurological: neg Psychiatric: neg Sleep : neg   ALLERGIES: No Known Allergies  HOME MEDICATIONS: Outpatient Prescriptions Prior to Visit  Medication Sig Dispense Refill  . clotrimazole-betamethasone (LOTRISONE) cream Apply 1 application topically as needed.    . lamoTRIgine (LAMICTAL) 200 MG tablet Take 1 tablet (200 mg total) by mouth daily. 90 tablet 3  . levothyroxine (SYNTHROID, LEVOTHROID) 137 MCG tablet Take 137 mcg by mouth daily.     No facility-administered medications prior to visit.    PAST MEDICAL HISTORY: Past Medical History  Diagnosis Date  . Crohn's disease ([place])   . Rib fractures 02/2015    x 3  . Shoulder fracture, left 02/2015  . H/O: facial fractures 02/2015  . Seizures ([place]) 2005    x 3    PAST SURGICAL HISTORY: Past Surgical History  Procedure Laterality Date  . Abdominal surgery  12/11    FAMILY HISTORY: Family History  Problem Relation Age of Onset  . Cancer Father     SOCIAL HISTORY: Social History   Social History  . Marital Status: Married    Spouse Name: retta  . Number of Children: 2  . Years of Education: college    Occupational History  . ESTIMATOR    Social History Main Topics  .  Smoking status: Current Every Day Smoker -- 0.50 packs/day    Types: Cigarettes  . Smokeless tobacco: Not on file  . Alcohol Use: No  . Drug Use: No  . Sexual Activity: Yes   Other Topics Concern  . Not on file   Social History Narrative     PHYSICAL EXAM  Filed Vitals:   09/20/15 0752  BP: 118/74  Pulse: 77  Height: 5' 6"  (1.676 m)  Weight: 167 lb 6.4 oz (75.932 kg)   Body mass index is 27.03 kg/(m^2).  Generalized: Well developed, in no acute distress  Head:  normocephalic and atraumatic,. Oropharynx benign  Neck: Supple, no carotid bruits  Cardiac: Regular rate rhythm, no murmur  Musculoskeletal: No deformity   Neurological examination   Mentation: Alert oriented to time, place, history taking. Attention span and concentration appropriate. Recent and remote memory intact.  Follows all commands speech and language fluent.   Cranial nerve II-XII: Pupils were equal round reactive to light extraocular movements were full, visual field were full on confrontational test. Facial sensation and strength were normal. hearing was intact to finger rubbing bilaterally. Uvula tongue midline. head turning and shoulder shrug were normal and symmetric.Tongue protrusion into cheek strength was normal. Motor: normal bulk and tone, full strength in the BUE, BLE, fine finger movements normal, no pronator drift. No focal weakness Sensory: normal and symmetric to light touch, pinprick, and  Vibration, proprioception  Coordination: finger-nose-finger, heel-to-shin bilaterally, no dysmetria Reflexes: 1+ upper lower and symmetric plantar responses were flexor bilaterally. Gait and Station: Rising up from seated position without assistance, normal stance,  moderate stride, good arm swing, smooth turning, able to perform tiptoe, and heel walking without difficulty. Tandem gait is steady  DIAGNOSTIC DATA (LABS, IMAGING, TESTING) - I reviewed patient records, labs, notes, testing and imaging myself where available.  Lab Results  Component Value Date   WBC 8.4 09/22/2014   HGB 16.7 09/22/2014   HCT 49.5 09/22/2014   MCV 91 09/22/2014   PLT 221 09/22/2014      Component Value Date/Time   NA 138 09/22/2014 1048   NA 137 07/08/2010 0545   K 5.3* 09/22/2014 1048   CL 99 09/22/2014 1048   CO2 25 09/22/2014 1048   GLUCOSE 105* 09/22/2014 1048   GLUCOSE 75 07/08/2010 0545   BUN 14 09/22/2014 1048   BUN 10 07/08/2010 0545   CREATININE 1.10 09/22/2014 1048   CALCIUM 10.0  09/22/2014 1048   PROT 7.6 09/22/2014 1048   PROT 6.7 06/28/2010 0845   ALBUMIN 4.3 09/22/2014 1048   ALBUMIN 3.0* 06/28/2010 0845   AST 25 09/22/2014 1048   ALT 30 09/22/2014 1048   ALKPHOS 74 09/22/2014 1048   BILITOT 0.7 09/22/2014 1048   BILITOT 0.5 06/28/2010 0845   GFRNONAA 72 09/22/2014 1048   GFRAA 83 09/22/2014 1048    ASSESSMENT AND PLAN  65 y.o. year old male  has a past medical history of Crohn's disease ([place]); Rib fractures (02/2015); Shoulder fracture, left (02/2015); H/O: facial fractures (02/2015); and Seizures ([place]) (2005). here to follow-up for his seizure disorder. Last seizure was 11 years ago.  Continue Lamictal at current dose will refill for one year Follow-up yearly and when necessary Dennie Bible, [facility], [facility], [facility] [address], [place], [place] [address] [phone]

## 2015-09-20 NOTE — Patient Instructions (Signed)
Continue Lamictal at current dose will refill for one year Follow-up yearly and when necessary  

## 2015-09-20 NOTE — Progress Notes (Signed)
I agree with the above plan 

## 2015-10-02 ENCOUNTER — Other Ambulatory Visit: Payer: Self-pay | Admitting: Neurology

## 2016-09-19 ENCOUNTER — Encounter: Payer: Self-pay | Admitting: Nurse Practitioner

## 2016-09-19 ENCOUNTER — Encounter (INDEPENDENT_AMBULATORY_CARE_PROVIDER_SITE_OTHER): Payer: Self-pay

## 2016-09-19 ENCOUNTER — Ambulatory Visit (INDEPENDENT_AMBULATORY_CARE_PROVIDER_SITE_OTHER): Payer: PRIVATE HEALTH INSURANCE | Admitting: Nurse Practitioner

## 2016-09-19 ENCOUNTER — Other Ambulatory Visit: Payer: Self-pay | Admitting: Nurse Practitioner

## 2016-09-19 VITALS — BP 127/78 | HR 76 | Ht 66.0 in | Wt 156.8 lb

## 2016-09-19 DIAGNOSIS — G40209 Localization-related (focal) (partial) symptomatic epilepsy and epileptic syndromes with complex partial seizures, not intractable, without status epilepticus: Secondary | ICD-10-CM | POA: Diagnosis not present

## 2016-09-19 MED ORDER — LAMOTRIGINE 200 MG PO TABS: 200.0000 mg | ORAL_TABLET | Freq: Every day | ORAL | 3 refills | Status: DC

## 2016-09-19 NOTE — Patient Instructions (Signed)
Continue Lamictal at current dose will refill for one year Follow-up yearly and when necessary
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
## 2016-09-20 NOTE — Progress Notes (Signed)
I agree with the above plan 

## 2016-09-24 ENCOUNTER — Other Ambulatory Visit: Payer: Self-pay | Admitting: Nurse Practitioner

## 2017-09-16 ENCOUNTER — Telehealth: Payer: Self-pay | Admitting: Neurology

## 2017-09-16 MED ORDER — LAMOTRIGINE 200 MG PO TABS: 200.0000 mg | ORAL_TABLET | Freq: Every day | ORAL | 3 refills | Status: DC

## 2017-09-16 NOTE — Telephone Encounter (Signed)
Pt calling for refill on lamoTRIgine (LAMICTAL) 200 MG tablet  [facility] [place] [place] [facility] [place] [phone] (Phone) [phone] (Fax)

## 2017-09-16 NOTE — Addendum Note (Signed)
Addended by: Minna Antis on: 09/16/2017 02:44 PM   Modules accepted: Orders

## 2017-09-18 ENCOUNTER — Other Ambulatory Visit: Payer: Self-pay | Admitting: Nurse Practitioner

## 2017-09-19 ENCOUNTER — Ambulatory Visit: Payer: PRIVATE HEALTH INSURANCE | Admitting: Nurse Practitioner
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
## 2017-11-20 ENCOUNTER — Ambulatory Visit (INDEPENDENT_AMBULATORY_CARE_PROVIDER_SITE_OTHER): Payer: PRIVATE HEALTH INSURANCE | Admitting: Nurse Practitioner

## 2017-11-20 ENCOUNTER — Encounter: Payer: Self-pay | Admitting: Nurse Practitioner

## 2017-11-20 VITALS — BP 112/66 | HR 80 | Ht 66.0 in | Wt 161.6 lb

## 2017-11-20 DIAGNOSIS — G40209 Localization-related (focal) (partial) symptomatic epilepsy and epileptic syndromes with complex partial seizures, not intractable, without status epilepticus: Secondary | ICD-10-CM

## 2017-11-20 MED ORDER — LAMOTRIGINE 200 MG PO TABS: 200.0000 mg | ORAL_TABLET | Freq: Every day | ORAL | 3 refills | Status: DC

## 2017-11-20 NOTE — Progress Notes (Signed)
I have read the note, and I agree with the clinical assessment and plan.  Richard A. Sater, MD, PhD, FAAN Certified in Neurology, Clinical Neurophysiology, Sleep Medicine, Pain Medicine and Neuroimaging  [facility] [address], [address] [place], [place] [address] [phone]  

## 2017-11-20 NOTE — Patient Instructions (Signed)
Continue Lamictal at current dose will refill for one year Follow-up yearly and when necessary

## 2018-11-18 ENCOUNTER — Telehealth: Payer: Self-pay

## 2018-11-18 NOTE — Telephone Encounter (Signed)
Unable to get in contact with the patient to convert his office visit with Janett Billow on 11/23/2018 into a [email or web address] visit. I left a voicemail asking him to return my call. Office number was provided.

## 2018-11-19 ENCOUNTER — Telehealth: Payer: Self-pay

## 2018-11-19 NOTE — Telephone Encounter (Signed)
Unable to reach pt vm was full. COuld not leave message to explain visit will be change to video due to COVID 19.

## 2018-11-23 ENCOUNTER — Ambulatory Visit: Payer: PRIVATE HEALTH INSURANCE | Admitting: Adult Health

## 2018-11-23 ENCOUNTER — Ambulatory Visit: Payer: PRIVATE HEALTH INSURANCE | Admitting: Nurse Practitioner

## 2018-11-23 ENCOUNTER — Other Ambulatory Visit: Payer: Self-pay | Admitting: *Deleted

## 2018-11-23 MED ORDER — LAMOTRIGINE 200 MG PO TABS: 200.0000 mg | ORAL_TABLET | Freq: Every day | ORAL | 0 refills | Status: DC

## 2018-11-24 NOTE — Telephone Encounter (Signed)
I called pt that visit will be change to video due to covid 19. Pt gave verbal consent to do video and to file insurance. PT gave cell number with [employer] service,and confirmed email. PT has a computer and samsung phone. I sent pt a email with the link and I text him the link. Pt confirmed he receive both links. I advise to click on lin 10 minutes prior to visit. PT verbalized understanding.

## 2018-11-24 NOTE — Telephone Encounter (Signed)
Patient called and spoke to me and he wants to do a video  visit [phone] .  I dont have Janett Billow links to schedule.

## 2018-11-25 ENCOUNTER — Encounter: Payer: Self-pay | Admitting: Neurology

## 2018-11-25 ENCOUNTER — Other Ambulatory Visit: Payer: Self-pay

## 2018-11-25 ENCOUNTER — Ambulatory Visit (INDEPENDENT_AMBULATORY_CARE_PROVIDER_SITE_OTHER): Payer: PRIVATE HEALTH INSURANCE | Admitting: Neurology

## 2018-11-25 DIAGNOSIS — R569 Unspecified convulsions: Secondary | ICD-10-CM

## 2018-11-25 MED ORDER — LAMOTRIGINE 200 MG PO TABS: 200.0000 mg | ORAL_TABLET | Freq: Every day | ORAL | 3 refills | Status: DC

## 2018-11-25 NOTE — Progress Notes (Signed)
Virtual Visit via Video Note  I connected with Miguel Hoover. on 11/25/18 at  1:30 PM EDT by a video enabled telemedicine application and verified that I am speaking with the correct person using two identifiers.this visit was performed using [email or web address] app  Location: Patient: at his [place] office  I discussed the limitations of evaluation and management by telemedicine and the availability of in person appointments. The patient expressed understanding and agreed to proceed.  History of Present Illness: Miguel Hoover is a 13 year Caucasian male seen today for a follow-up visit his last visit on 11/20/2017.  He continues to do well and has not had any recurrent seizures since last 14 years.  He initially had 3 seizures in the 6 months.  Ever since he has been on lamotrigine he has done well without breakthrough seizures.  He is currently on 200 mg daily and is tolerating it well without any side effects.  He had lab work done [place] visit with primary care physician earlier this year was fine.  Thyroid medication dose was adjusted.  In fact  He was asked to discontinue Lipitor as his lipids were so good.  He has no new complaints today.   Observations/Objective: Physical and neurological exam is limited due to constraints of video exam.  Pleasant middle-aged Caucasian male not in distress.  He is awake alert oriented to time place and person.  Speech and language appear normal.  Extra ocular movements are full range without nystagmus.  Face is symmetric without weakness.  Tongue midline.  System exam symmetric equal strength.  No focal weakness.  Finger-to-nose coordination intact.  Gait not tested  Assessment   68 y.o. year old male  has a past medical history of Crohn's disease ([place]); Rib fractures (02/2015); Shoulder fracture, left (02/2015); H/O: facial fractures (02/2015); and Seizures ([place]) (2005). here to follow-up for his seizure disorder. Last seizure was 13 years ago. He is currently  well-controlled on Lamictal  Plan: Patient has remained seizure-free for 14 years low-dose Lamictal.  I discussed with him whether he wanted to taper and discontinue it but patient is unwilling.  He is given 1 year refill of Lamictal. Follow Up Instructions: Return for follow-up in a year or call earlier if necessary   I discussed the assessment and treatment plan with the patient. The patient was provided an opportunity to ask questions and all were answered. The patient agreed with the plan and demonstrated an understanding of the instructions.   The patient was advised to call back or seek an in-person evaluation if the symptoms worsen or if the condition fails to improve as anticipated.  I provided 15 minutes of non-face-to-face time during this encounter.   Miguel Contras, MD

## 2019-03-29 ENCOUNTER — Other Ambulatory Visit: Payer: Self-pay | Admitting: Gastroenterology

## 2019-03-29 DIAGNOSIS — K50918 Crohn's disease, unspecified, with other complication: Secondary | ICD-10-CM

## 2019-04-06 ENCOUNTER — Ambulatory Visit
Admission: RE | Admit: 2019-04-06 | Discharge: 2019-04-06 | Disposition: A | Discharge status: Home / Self Care | Payer: PRIVATE HEALTH INSURANCE | Source: Ambulatory Visit | Attending: Gastroenterology | Admitting: Gastroenterology

## 2019-04-06 DIAGNOSIS — K50918 Crohn's disease, unspecified, with other complication: Secondary | ICD-10-CM

## 2019-04-06 MED ORDER — IOPAMIDOL (ISOVUE-300) INJECTION 61%
100.0000 mL | Freq: Once | INTRAVENOUS | Status: AC | PRN
  Administered 2019-04-06: 100 mL via INTRAVENOUS

## 2019-05-21 ENCOUNTER — Other Ambulatory Visit: Payer: Self-pay | Admitting: Gastroenterology

## 2019-05-21 ENCOUNTER — Ambulatory Visit
Admission: RE | Admit: 2019-05-21 | Discharge: 2019-05-21 | Disposition: A | Discharge status: Home / Self Care | Payer: PRIVATE HEALTH INSURANCE | Source: Ambulatory Visit | Attending: Gastroenterology | Admitting: Gastroenterology

## 2019-05-21 DIAGNOSIS — K50019 Crohn's disease of small intestine with unspecified complications: Secondary | ICD-10-CM

## 2019-05-25 ENCOUNTER — Other Ambulatory Visit: Payer: Self-pay

## 2019-05-25 ENCOUNTER — Encounter: Payer: Self-pay | Admitting: Neurology

## 2019-05-25 ENCOUNTER — Ambulatory Visit (INDEPENDENT_AMBULATORY_CARE_PROVIDER_SITE_OTHER): Payer: PRIVATE HEALTH INSURANCE | Admitting: Neurology

## 2019-05-25 VITALS — BP 127/83 | HR 71 | Temp 97.7°F | Wt 160.0 lb

## 2019-05-25 DIAGNOSIS — R251 Tremor, unspecified: Secondary | ICD-10-CM | POA: Diagnosis not present
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
## 2019-05-25 NOTE — Patient Instructions (Signed)
I had a long discussion with the patient regarding his symptoms of intermittent head and left upper extremity tremor as well as subjective left leg weakness and answered questions.  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  The tremor does not appear to be functionally disabling at the present time.  Recommend check MRI scan of the brain with and without contrast, lamotrigine level, thyroid function test, CBC and CMP.  Continue lamotrigine 200 mg daily for his remote history of seizures and he has been seizure-free for more than indicated but is not willing to discontinue the medicine.  Return for follow-up in the future as necessary.  Essential Tremor A tremor is trembling or shaking that a person cannot control. Most tremors affect the hands or arms. Tremors can also affect the head, vocal cords, legs, and other parts of the body. Essential tremor is a tremor without a known cause. Usually, it occurs while a person is trying to perform an action. It tends to get worse gradually as a person ages. What are the causes? The cause of this condition is not known. What increases the risk? You are more likely to develop this condition if:  You have a family member with essential tremor.  You are age 41 or older.  You take certain medicines. What are the signs or symptoms? The main sign of a tremor is a rhythmic shaking of certain parts of your body that is uncontrolled and unintentional. You may:  Have difficulty eating with a spoon or fork.  Have difficulty writing.  Nod your head up and down or side to side.  Have a quivering voice. The shaking may:  Get worse over time.  Come and go.  Be more noticeable on one side of your body.  Get worse due to stress, fatigue, caffeine, and extreme heat or cold. How is this diagnosed? This condition may be diagnosed based on:  Your symptoms and medical history.  A physical exam. There is  no single test to diagnose an essential tremor. However, your health care provider may order tests to rule out other causes of your condition. These may include:  Blood and urine tests.  Imaging studies of your brain, such as CT scan and MRI.  A test that measures involuntary muscle movement (electromyogram). How is this treated? Treatment for essential tremor depends on the severity of the condition.  Some tremors may go away without treatment.  Mild tremors may not need treatment if they do not affect your day-to-day life.  Severe tremors may need to be treated using one or more of the following options: ? Medicines. ? Lifestyle changes. ? Occupational or physical therapy. Follow these instructions at home: Lifestyle   Do not use any products that contain nicotine or tobacco, such as cigarettes and e-cigarettes. If you need help quitting, ask your health care provider.  Limit your caffeine intake as told by your health care provider.  Try to get 8 hours of sleep each night.  Find ways to manage your stress that fits your lifestyle and personality. Consider trying meditation or yoga.  Try to anticipate stressful situations and allow extra time to manage them.  If you are struggling emotionally with the effects of your tremor, consider working with a mental health provider. General instructions  Take over-the-counter and prescription medicines only as told by your health care provider.  Avoid extreme heat and extreme cold.  Keep all follow-up visits as told  by your health care provider. This is important. Visits may include physical therapy visits. Contact a health care provider if:  You experience any changes in the location or intensity of your tremors.  You start having a tremor after starting a new medicine.  You have tremor with other symptoms, such as: ? Numbness. ? Tingling. ? Pain. ? Weakness.  Your tremor gets worse.  Your tremor interferes with your  daily life.  You feel down, blue, or sad for at least 2 weeks in a row.  Worrying about your tremor and what other people think about you interferes with your everyday life functions, including relationships, work, or school. Summary  Essential tremor is a tremor without a known cause. Usually, it occurs when you are trying to perform an action.  The cause of this condition is not known.  The main sign of a tremor is a rhythmic shaking of certain parts of your body that is uncontrolled and unintentional.  Treatment for essential tremor depends on the severity of the condition. This information is not intended to replace advice given to you by your health care provider. Make sure you discuss any questions you have with your health care provider. Document Released: 07/15/2014 Document Revised: 07/04/2017 Document Reviewed: 07/04/2017 Elsevier Patient Education  2020 [employer].

## 2019-05-28 LAB — CBC
Hematocrit: 48.7 % (ref 37.5–51.0)
Hemoglobin: 16.9 g/dL (ref 13.0–17.7)
MCH: 31.2 pg (ref 26.6–33.0)
MCHC: 34.7 g/dL (ref 31.5–35.7)
MCV: 90 fL (ref 79–97)
Platelets: 239 10*3/uL (ref 150–450)
RBC: 5.41 x10E6/uL (ref 4.14–5.80)
RDW: 13.1 % (ref 11.6–15.4)
WBC: 9.8 10*3/uL (ref 3.4–10.8)

## 2019-05-28 LAB — COMPREHENSIVE METABOLIC PANEL
ALT: 18 IU/L (ref 0–44)
AST: 20 IU/L (ref 0–40)
Albumin/Globulin Ratio: 1.3 (ref 1.2–2.2)
Albumin: 4.2 g/dL (ref 3.8–4.8)
Alkaline Phosphatase: 73 IU/L (ref 39–117)
BUN/Creatinine Ratio: 14 (ref 10–24)
BUN: 16 mg/dL (ref 8–27)
Bilirubin Total: 0.6 mg/dL (ref 0.0–1.2)
CO2: 26 mmol/L (ref 20–29)
Calcium: 9.7 mg/dL (ref 8.6–10.2)
Chloride: 98 mmol/L (ref 96–106)
Creatinine, Ser: 1.15 mg/dL (ref 0.76–1.27)
GFR calc Af Amer: 76 mL/min/{1.73_m2} (ref >59)
GFR calc non Af Amer: 66 mL/min/{1.73_m2} (ref >59)
Globulin, Total: 3.3 g/dL (ref 1.5–4.5)
Glucose: 87 mg/dL (ref 65–99)
Potassium: 4.6 mmol/L (ref 3.5–5.2)
Sodium: 136 mmol/L (ref 134–144)
Total Protein: 7.5 g/dL (ref 6.0–8.5)

## 2019-05-28 LAB — THYROID PANEL WITH TSH
Free Thyroxine Index: 3.5 (ref 1.2–4.9)
T3 Uptake Ratio: 32 % (ref 24–39)
T4, Total: 11 ug/dL (ref 4.5–12.0)
TSH: 1.63 u[IU]/mL (ref 0.450–4.500)

## 2019-05-28 LAB — LAMOTRIGINE LEVEL: Lamotrigine Lvl: 4.9 ug/mL (ref 2.0–20.0)

## 2019-07-28 ENCOUNTER — Ambulatory Visit: Payer: PRIVATE HEALTH INSURANCE | Attending: Internal Medicine

## 2019-07-28 DIAGNOSIS — Z23 Encounter for immunization: Secondary | ICD-10-CM | POA: Insufficient documentation

## 2019-07-28 NOTE — Progress Notes (Signed)
   [id number]  Name:  Miguel Hoover.    MRN: [id number] DOB: Jul 22, 1950  07/28/2019  [name] was observed post Covid-19 immunization for 15 minutes without incidence. He was provided with Vaccine Information Sheet and instruction to access the V-Safe system.   [name] was instructed to call 911 with any severe reactions post vaccine: Marland Kitchen Difficulty breathing  . Swelling of your face and throat  . A fast heartbeat  . A bad rash all over your body  . Dizziness and weakness    Immunizations Administered    Name Date Dose VIS Date Route   Pfizer COVID-19 Vaccine 07/28/2019  5:00 PM 0.3 mL 06/18/2019 Intramuscular   Manufacturer: [place]   Lot: [id number]   [place]: [id number]

## 2019-08-04 ENCOUNTER — Ambulatory Visit: Payer: PRIVATE HEALTH INSURANCE

## 2019-08-13 ENCOUNTER — Ambulatory Visit: Payer: PRIVATE HEALTH INSURANCE

## 2019-08-15 ENCOUNTER — Ambulatory Visit: Payer: PRIVATE HEALTH INSURANCE

## 2019-08-16 ENCOUNTER — Ambulatory Visit: Payer: PRIVATE HEALTH INSURANCE | Attending: Internal Medicine

## 2019-08-16 DIAGNOSIS — Z23 Encounter for immunization: Secondary | ICD-10-CM

## 2019-08-16 NOTE — Progress Notes (Signed)
   [id number]  Name:  Miguel Hoover.    MRN: [id number] DOB: Sep 16, 1950  08/16/2019  [name] was observed post Covid-19 immunization for 15 minutes without incidence. He was provided with Vaccine Information Sheet and instruction to access the V-Safe system.   [name] was instructed to call 911 with any severe reactions post vaccine: Marland Kitchen Difficulty breathing  . Swelling of your face and throat  . A fast heartbeat  . A bad rash all over your body  . Dizziness and weakness    Immunizations Administered    Name Date Dose VIS Date Route   Pfizer COVID-19 Vaccine 08/16/2019  9:48 AM 0.3 mL 06/18/2019 Intramuscular   Manufacturer: [place]   Lot: [id number]   [place]: [id number]

## 2019-08-20 ENCOUNTER — Ambulatory Visit: Payer: PRIVATE HEALTH INSURANCE

## 2019-11-26 ENCOUNTER — Other Ambulatory Visit: Payer: Self-pay | Admitting: Neurology

## 2019-11-29 ENCOUNTER — Other Ambulatory Visit: Payer: Self-pay

## 2019-11-29 MED ORDER — LAMOTRIGINE 200 MG PO TABS
200.0000 mg | ORAL_TABLET | Freq: Every day | ORAL | 1 refills | Status: DC
Start: 2019-11-29 — End: 2020-05-31

## 2019-11-29 NOTE — Telephone Encounter (Signed)
Refill sent for lamotrigine for 6 months. Pt last seen 05/2019 next refill goes to his primary doctor. PEr MD note pt is to only follow up as necessary in our office.

## 2020-05-27 ENCOUNTER — Other Ambulatory Visit: Payer: Self-pay | Admitting: Neurology

## 2020-05-29 ENCOUNTER — Other Ambulatory Visit: Payer: Self-pay | Admitting: Neurology

## 2020-05-30 ENCOUNTER — Telehealth: Payer: Self-pay | Admitting: Neurology

## 2020-05-30 NOTE — Telephone Encounter (Signed)
Pt called wanting a refill on his lamoTRIgine (LAMICTAL) 200 MG tablet but states that the pharmacy informed him that it was denied. Pt was informed that he would have to schedule a yearly f/u for [place] to continue to refill his medications. Pt states that he was told that he does not need to come back but to stay on the medication. Please advise.

## 2020-05-31 ENCOUNTER — Other Ambulatory Visit: Payer: Self-pay | Admitting: Emergency Medicine

## 2020-05-31 NOTE — Telephone Encounter (Signed)
Called patient and discussed he would need follow up, last visit was 05/22/19.  Patient agreed to office FU on 07/05/20 @ 330, arrival time of 315.  Patient expressed appreciation.  90 day refill sent in, patient understands he must make this appointment for any additional refills.

## 2020-07-05 ENCOUNTER — Ambulatory Visit: Payer: Self-pay | Admitting: Neurology

## 2020-07-17 ENCOUNTER — Telehealth: Payer: Self-pay | Admitting: Neurology

## 2020-07-20 ENCOUNTER — Ambulatory Visit: Payer: PRIVATE HEALTH INSURANCE | Admitting: Neurology

## 2020-08-29 NOTE — Telephone Encounter (Signed)
Late entry: pt scheduled for 09/14/20 per pt request.

## 2020-09-14 ENCOUNTER — Encounter: Payer: Self-pay | Admitting: Neurology

## 2020-09-14 ENCOUNTER — Ambulatory Visit: Payer: PRIVATE HEALTH INSURANCE | Admitting: Neurology

## 2020-09-14 ENCOUNTER — Ambulatory Visit (INDEPENDENT_AMBULATORY_CARE_PROVIDER_SITE_OTHER): Payer: PRIVATE HEALTH INSURANCE | Admitting: Neurology

## 2020-09-14 ENCOUNTER — Other Ambulatory Visit: Payer: Self-pay

## 2020-09-14 VITALS — BP 136/85 | HR 88 | Ht 66.0 in | Wt 159.4 lb

## 2020-09-14 DIAGNOSIS — G40909 Epilepsy, unspecified, not intractable, without status epilepticus: Secondary | ICD-10-CM | POA: Diagnosis not present

## 2020-09-14 MED ORDER — LAMOTRIGINE 200 MG PO TABS: 200.0000 mg | ORAL_TABLET | Freq: Every day | ORAL | 3 refills | Status: DC

## 2020-09-14 NOTE — Patient Instructions (Signed)
I had a long discussion with the patient with regards to his remote seizures which appear quite well controlled on the present medication regimen of lamotrigine 200 mg daily and he has been seizure-free for nearly 12 years.  Is reluctant to consider tapering and discontinuing medication.  I gave him a refill for a year and asked him to avoid seizure provoking stimuli like abrupt medication discontinuation, irregular sleeping habits and extremes of exertion and stimulants like alcohol marijuana and street drugs.  He will return for follow-up in the future in 1 year or call earlier if necessary.
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
## 2021-01-31 ENCOUNTER — Other Ambulatory Visit: Payer: Self-pay | Admitting: Physician Assistant

## 2021-01-31 DIAGNOSIS — K5000 Crohn's disease of small intestine without complications: Secondary | ICD-10-CM

## 2021-01-31 DIAGNOSIS — R197 Diarrhea, unspecified: Secondary | ICD-10-CM

## 2021-02-20 ENCOUNTER — Ambulatory Visit
Admission: RE | Admit: 2021-02-20 | Discharge: 2021-02-20 | Disposition: A | Discharge status: Home / Self Care | Payer: No Typology Code available for payment source | Source: Ambulatory Visit | Attending: Physician Assistant | Admitting: Physician Assistant

## 2021-02-20 ENCOUNTER — Other Ambulatory Visit: Payer: Self-pay

## 2021-02-20 DIAGNOSIS — K5000 Crohn's disease of small intestine without complications: Secondary | ICD-10-CM

## 2021-02-20 DIAGNOSIS — R197 Diarrhea, unspecified: Secondary | ICD-10-CM

## 2021-02-20 MED ORDER — IOPAMIDOL (ISOVUE-300) INJECTION 61%
100.0000 mL | Freq: Once | INTRAVENOUS | Status: AC | PRN
  Administered 2021-02-20: 100 mL via INTRAVENOUS

## 2021-02-22 ENCOUNTER — Other Ambulatory Visit: Payer: Self-pay | Admitting: Physician Assistant

## 2021-02-22 DIAGNOSIS — K869 Disease of pancreas, unspecified: Secondary | ICD-10-CM

## 2021-03-17 ENCOUNTER — Ambulatory Visit
Admission: RE | Admit: 2021-03-17 | Discharge: 2021-03-17 | Disposition: A | Discharge status: Home / Self Care | Payer: No Typology Code available for payment source | Source: Ambulatory Visit | Attending: Physician Assistant | Admitting: Physician Assistant

## 2021-03-17 ENCOUNTER — Other Ambulatory Visit: Payer: Self-pay

## 2021-03-17 DIAGNOSIS — K869 Disease of pancreas, unspecified: Secondary | ICD-10-CM

## 2021-03-17 MED ORDER — GADOBENATE DIMEGLUMINE 529 MG/ML IV SOLN
14.0000 mL | Freq: Once | INTRAVENOUS | Status: AC | PRN
  Administered 2021-03-17: 14 mL via INTRAVENOUS

## 2021-09-19 ENCOUNTER — Other Ambulatory Visit: Payer: Self-pay

## 2021-09-19 ENCOUNTER — Encounter: Payer: Self-pay | Admitting: Adult Health

## 2021-09-19 ENCOUNTER — Ambulatory Visit: Payer: Federal, State, Local not specified - PPO | Admitting: Adult Health

## 2021-09-19 VITALS — BP 137/82 | HR 75 | Ht 65.0 in | Wt 154.0 lb

## 2021-09-19 DIAGNOSIS — G40909 Epilepsy, unspecified, not intractable, without status epilepticus: Secondary | ICD-10-CM

## 2021-09-19 DIAGNOSIS — G250 Essential tremor: Secondary | ICD-10-CM

## 2021-09-19 MED ORDER — LAMOTRIGINE 200 MG PO TABS: 200.0000 mg | ORAL_TABLET | Freq: Every day | ORAL | 3 refills | Status: DC
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
## 2021-09-19 NOTE — Patient Instructions (Addendum)
Your Plan: ? ?Continue current dose of lamotrigine for seizure prevention -refill provided ? ?Continue to avoid seizure provoking triggers and call with any potential seizure activity ? ? ? ?Follow-up in 1 year or call earlier if needed  ? ? ? ? ?Thank you for coming to see [place] at [facility]. I hope we have been able to provide you high quality care today. ? ?You may receive a patient satisfaction survey over the next few weeks. We would appreciate your feedback and comments so that we may continue to improve ourselves and the health of our patients. ? ?

## 2021-10-18 ENCOUNTER — Other Ambulatory Visit: Payer: Self-pay | Admitting: Surgery

## 2021-10-18 DIAGNOSIS — D3A8 Other benign neuroendocrine tumors: Secondary | ICD-10-CM

## 2021-11-12 ENCOUNTER — Other Ambulatory Visit: Payer: Self-pay | Admitting: Neurology

## 2021-11-12 NOTE — Telephone Encounter (Signed)
Rx refilled.

## 2021-11-13 ENCOUNTER — Ambulatory Visit
Admission: RE | Admit: 2021-11-13 | Discharge: 2021-11-13 | Disposition: A | Discharge status: Home / Self Care | Payer: Federal, State, Local not specified - PPO | Source: Ambulatory Visit | Attending: Surgery | Admitting: Surgery

## 2021-11-13 DIAGNOSIS — D3A8 Other benign neuroendocrine tumors: Secondary | ICD-10-CM

## 2021-11-13 MED ORDER — IOPAMIDOL (ISOVUE-300) INJECTION 61%
100.0000 mL | Freq: Once | INTRAVENOUS | Status: AC | PRN
  Administered 2021-11-13: 100 mL via INTRAVENOUS

## 2022-05-09 ENCOUNTER — Encounter: Payer: Self-pay | Admitting: Surgery

## 2022-05-13 ENCOUNTER — Other Ambulatory Visit: Payer: Self-pay | Admitting: Surgery

## 2022-05-13 ENCOUNTER — Encounter: Payer: Self-pay | Admitting: Surgery

## 2022-05-13 DIAGNOSIS — R1011 Right upper quadrant pain: Secondary | ICD-10-CM

## 2022-05-13 DIAGNOSIS — D3A8 Other benign neuroendocrine tumors: Secondary | ICD-10-CM

## 2022-05-24 ENCOUNTER — Other Ambulatory Visit: Payer: Federal, State, Local not specified - PPO

## 2022-05-28 ENCOUNTER — Ambulatory Visit
Admission: RE | Admit: 2022-05-28 | Discharge: 2022-05-28 | Disposition: A | Discharge status: Home / Self Care | Payer: Federal, State, Local not specified - PPO | Source: Ambulatory Visit | Attending: Surgery | Admitting: Surgery

## 2022-05-28 DIAGNOSIS — D3A8 Other benign neuroendocrine tumors: Secondary | ICD-10-CM

## 2022-05-28 DIAGNOSIS — R1011 Right upper quadrant pain: Secondary | ICD-10-CM

## 2022-05-28 MED ORDER — IOPAMIDOL (ISOVUE-300) INJECTION 61%
100.0000 mL | Freq: Once | INTRAVENOUS | Status: AC | PRN
  Administered 2022-05-28: 100 mL via INTRAVENOUS
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
## 2022-09-23 ENCOUNTER — Other Ambulatory Visit: Payer: Self-pay | Admitting: Neurology

## 2022-09-23 ENCOUNTER — Ambulatory Visit: Payer: Federal, State, Local not specified - PPO | Admitting: Adult Health

## 2022-09-23 ENCOUNTER — Encounter: Payer: Self-pay | Admitting: Adult Health

## 2022-09-23 VITALS — BP 143/75 | HR 77 | Ht 66.0 in | Wt 155.8 lb

## 2022-09-23 DIAGNOSIS — G40909 Epilepsy, unspecified, not intractable, without status epilepticus: Secondary | ICD-10-CM

## 2022-09-23 DIAGNOSIS — G250 Essential tremor: Secondary | ICD-10-CM

## 2022-09-23 MED ORDER — LAMOTRIGINE 200 MG PO TABS: 200.0000 mg | ORAL_TABLET | Freq: Two times a day (BID) | ORAL | 3 refills | Status: DC

## 2022-09-23 NOTE — Patient Instructions (Signed)
It was great to see you again today!  Continue lamotrigine 200 mg twice daily for seizure prevention  We will check lamotrigine level today    Follow-up in 1 year or call earlier if needed

## 2022-09-25 LAB — LAMOTRIGINE LEVEL: Lamotrigine Lvl: 6.3 ug/mL (ref 2.0–20.0)

## 2022-11-09 ENCOUNTER — Other Ambulatory Visit: Payer: Self-pay | Admitting: Neurology

## 2023-04-22 ENCOUNTER — Other Ambulatory Visit: Payer: Self-pay | Admitting: Gastroenterology

## 2023-04-22 DIAGNOSIS — K50818 Crohn's disease of both small and large intestine with other complication: Secondary | ICD-10-CM

## 2023-05-06 ENCOUNTER — Ambulatory Visit
Admission: RE | Admit: 2023-05-06 | Discharge: 2023-05-06 | Disposition: A | Discharge status: Home / Self Care | Payer: Federal, State, Local not specified - PPO | Source: Ambulatory Visit | Attending: Gastroenterology | Admitting: Gastroenterology

## 2023-05-06 DIAGNOSIS — K50818 Crohn's disease of both small and large intestine with other complication: Secondary | ICD-10-CM

## 2023-05-06 MED ORDER — IOPAMIDOL (ISOVUE-300) INJECTION 61%
500.0000 mL | Freq: Once | INTRAVENOUS | Status: AC | PRN
  Administered 2023-05-06: 90 mL via INTRAVENOUS

## 2023-05-13 ENCOUNTER — Other Ambulatory Visit: Payer: Self-pay | Admitting: Surgery

## 2023-05-13 DIAGNOSIS — D3A8 Other benign neuroendocrine tumors: Secondary | ICD-10-CM
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
## 2023-09-25 ENCOUNTER — Ambulatory Visit: Payer: Federal, State, Local not specified - PPO | Admitting: Adult Health

## 2023-09-25 ENCOUNTER — Encounter: Payer: Self-pay | Admitting: Adult Health

## 2023-09-25 VITALS — BP 141/72 | HR 76 | Ht 66.0 in | Wt 154.0 lb

## 2023-09-25 DIAGNOSIS — Z5181 Encounter for therapeutic drug level monitoring: Secondary | ICD-10-CM

## 2023-09-25 DIAGNOSIS — G40909 Epilepsy, unspecified, not intractable, without status epilepticus: Secondary | ICD-10-CM | POA: Diagnosis not present

## 2023-09-25 DIAGNOSIS — G250 Essential tremor: Secondary | ICD-10-CM | POA: Diagnosis not present

## 2023-09-25 MED ORDER — LAMOTRIGINE 200 MG PO TABS: 200.0000 mg | ORAL_TABLET | Freq: Every day | ORAL | 3 refills | Status: AC

## 2023-09-25 NOTE — Patient Instructions (Signed)
 Your Plan:  Continue lamotrigine 200 mg twice daily for seizure prevention  Please call with any seizure activity    Follow-up in 1 year or call earlier if needed     Thank you for coming to see [place] at [facility]. I hope we have been able to provide you high quality care today.  You may receive a patient satisfaction survey over the next few weeks. We would appreciate your feedback and comments so that we may continue to improve ourselves and the health of our patients.

## 2023-09-26 LAB — LAMOTRIGINE LEVEL: Lamotrigine Lvl: 5 ug/mL (ref 2.0–20.0)

## 2023-09-27 ENCOUNTER — Encounter: Payer: Self-pay | Admitting: Adult Health

## 2023-12-23 ENCOUNTER — Other Ambulatory Visit: Payer: Self-pay | Admitting: Internal Medicine

## 2023-12-23 DIAGNOSIS — Z136 Encounter for screening for cardiovascular disorders: Secondary | ICD-10-CM

## 2024-01-01 ENCOUNTER — Ambulatory Visit
Admission: RE | Admit: 2024-01-01 | Discharge: 2024-01-01 | Disposition: A | Discharge status: Home / Self Care | Source: Ambulatory Visit | Attending: Internal Medicine | Admitting: Internal Medicine

## 2024-01-01 DIAGNOSIS — Z136 Encounter for screening for cardiovascular disorders: Secondary | ICD-10-CM

## 2024-07-30 ENCOUNTER — Ambulatory Visit
Admission: RE | Admit: 2024-07-30 | Discharge: 2024-07-30 | Disposition: A | Source: Ambulatory Visit | Attending: Gastroenterology

## 2024-07-30 ENCOUNTER — Encounter: Payer: Self-pay | Admitting: Radiology

## 2024-07-30 ENCOUNTER — Encounter: Payer: Self-pay | Admitting: Gastroenterology

## 2024-07-30 ENCOUNTER — Other Ambulatory Visit: Payer: Self-pay | Admitting: Gastroenterology

## 2024-07-30 DIAGNOSIS — K5000 Crohn's disease of small intestine without complications: Secondary | ICD-10-CM

## 2024-07-30 MED ORDER — IOPAMIDOL (ISOVUE-300) INJECTION 61%
125.0000 mL | Freq: Once | INTRAVENOUS | Status: AC | PRN
  Administered 2024-07-30: 125 mL via INTRAVENOUS

## 2024-09-30 ENCOUNTER — Ambulatory Visit: Admitting: Adult Health
